# Patient Record
Sex: Female | Born: 2000 | Race: Black or African American | Hispanic: No | Marital: Single | Smoking: Never smoker
Health system: Southern US, Community
[De-identification: ages and names within clinical notes are randomized; demographics above are authoritative.]

## PROBLEM LIST (undated history)

## (undated) ENCOUNTER — Ambulatory Visit (HOSPITAL_COMMUNITY): Admission: EM | Payer: Self-pay | Source: Home / Self Care

## (undated) ENCOUNTER — Ambulatory Visit (HOSPITAL_COMMUNITY): Admission: EM | Payer: Medicaid Other | Source: Home / Self Care

## (undated) DIAGNOSIS — L309 Dermatitis, unspecified: Secondary | ICD-10-CM

---
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
## 2022-10-20 ENCOUNTER — Emergency Department (HOSPITAL_COMMUNITY): Payer: Medicaid Other

## 2022-10-20 ENCOUNTER — Other Ambulatory Visit: Payer: Self-pay

## 2022-10-20 ENCOUNTER — Emergency Department (HOSPITAL_COMMUNITY)
Admission: EM | Admit: 2022-10-20 | Discharge: 2022-10-20 | Disposition: A | Discharge status: Home / Self Care | Payer: Medicaid Other | Attending: Emergency Medicine | Admitting: Emergency Medicine

## 2022-10-20 ENCOUNTER — Encounter (HOSPITAL_COMMUNITY): Payer: Self-pay

## 2022-10-20 DIAGNOSIS — J069 Acute upper respiratory infection, unspecified: Secondary | ICD-10-CM | POA: Diagnosis not present

## 2022-10-20 DIAGNOSIS — Z1152 Encounter for screening for COVID-19: Secondary | ICD-10-CM | POA: Diagnosis not present

## 2022-10-20 DIAGNOSIS — R0602 Shortness of breath: Secondary | ICD-10-CM | POA: Diagnosis present

## 2022-10-20 DIAGNOSIS — Z91010 Allergy to peanuts: Secondary | ICD-10-CM | POA: Insufficient documentation

## 2022-10-20 DIAGNOSIS — J45901 Unspecified asthma with (acute) exacerbation: Secondary | ICD-10-CM

## 2022-10-20 LAB — RESP PANEL BY RT-PCR (RSV, FLU A&B, COVID)  RVPGX2
Influenza A by PCR: NEGATIVE
Influenza B by PCR: NEGATIVE
Resp Syncytial Virus by PCR: NEGATIVE
SARS Coronavirus 2 by RT PCR: NEGATIVE

## 2022-10-20 MED ORDER — PREDNISONE 20 MG PO TABS
20.0000 mg | ORAL_TABLET | Freq: Once | ORAL | Status: AC
  Administered 2022-10-20: 20 mg via ORAL
  Filled 2022-10-20: qty 1

## 2022-10-20 MED ORDER — PREDNISONE 20 MG PO TABS: 20.0000 mg | ORAL_TABLET | Freq: Every day | ORAL | 0 refills | Status: AC

## 2022-10-20 MED ORDER — GUAIFENESIN-DM 100-10 MG/5ML PO SYRP: 10.0000 mL | ORAL_SOLUTION | Freq: Two times a day (BID) | ORAL | 0 refills | Status: AC

## 2022-10-20 MED ORDER — BUDESONIDE-FORMOTEROL FUMARATE 160-4.5 MCG/ACT IN AERO: 2.0000 | INHALATION_SPRAY | Freq: Two times a day (BID) | RESPIRATORY_TRACT | 0 refills | Status: AC

## 2022-10-20 MED ORDER — ALBUTEROL SULFATE HFA 108 (90 BASE) MCG/ACT IN AERS
8.0000 | INHALATION_SPRAY | Freq: Once | RESPIRATORY_TRACT | Status: AC
  Administered 2022-10-20: 8 via RESPIRATORY_TRACT
  Filled 2022-10-20: qty 6.7

## 2022-10-20 MED ORDER — IPRATROPIUM-ALBUTEROL 0.5-2.5 (3) MG/3ML IN SOLN: 3.0000 mL | Freq: Once | RESPIRATORY_TRACT | Status: DC

## 2022-10-20 MED ORDER — ALBUTEROL SULFATE HFA 108 (90 BASE) MCG/ACT IN AERS: 1.0000 | INHALATION_SPRAY | Freq: Four times a day (QID) | RESPIRATORY_TRACT | 0 refills | Status: AC | PRN

## 2022-10-20 NOTE — Progress Notes (Signed)
Per discussion w/ MD regarding HHN order, pt is in hallway w/ pending covid test, MD ordered MDI to be given instead of HHN.  Per MD, hold off giving [facility] for now.  No distress noted, per pt she "feels much better" after albuterol MDI was given.

## 2022-10-20 NOTE — ED Provider Notes (Signed)
[name] Provider Note   CSN: [id number] Arrival date & time: 10/20/22  1122     History  Chief Complaint  Patient presents with   Shortness of [place] is a 22 y.o. female.  Patient is a 22 year old female with past medical history of asthma presenting for shortness of breath.  Patient admits to redness of breath and wheezing, worse with ambulation, x 4 to 5 days.  Admits to recent upper respiratory symptoms including nasal congestion, rhinorrhea, and cough that she states is currently improving however cough is still present.  States she has a nebulizer at the house. Has been using Symbicort daily but PRN instead of scheduled.  Does not have a rescue albuterol inhaler.  No prior intubations or admissions for asthma exacerbations.  The history is provided by the patient. No language interpreter was used.  Shortness of Breath Associated symptoms: wheezing   Associated symptoms: no abdominal pain, no chest pain, no cough, no ear pain, no fever, no rash, no sore throat and no vomiting        Home Medications Prior to Admission medications   Medication Sig Start Date End Date Taking? Authorizing Provider  albuterol (VENTOLIN HFA) 108 (90 Base) MCG/ACT inhaler Inhale 1-2 puffs into the lungs every 6 (six) hours as needed for wheezing or shortness of breath. 0/1/02  Yes Campbell Stall P, DO  budesonide-formoterol (SYMBICORT) 160-4.5 MCG/ACT inhaler Inhale 2 puffs into the lungs in the morning and at bedtime. 03/20/52  Yes Campbell Stall P, DO  guaiFENesin-dextromethorphan (ROBITUSSIN DM) 100-10 MG/5ML syrup Take 10 mLs by mouth in the morning and at bedtime. 02/23/43  Yes Campbell Stall P, DO  predniSONE (DELTASONE) 20 MG tablet Take 1 tablet (20 mg total) by mouth daily for 5 days. 10/20/22 0/3/47 Yes Campbell Stall P, DO  beclomethasone (QVAR) 40 MCG/ACT inhaler Inhale 2 puffs into the lungs 2 (two) times daily. 07/15/13   Mariel Aloe, MD  beclomethasone (QVAR) 80 MCG/ACT inhaler Use as directed 11/13/13   Charmayne Sheer, NP  Cetirizine HCl (ZYRTEC ALLERGY) 10 MG CAPS 1 cap po qd 11/13/13   Charmayne Sheer, NP  cetirizine HCl (ZYRTEC) 5 MG/5ML SYRP Take 5 mLs (5 mg total) by mouth at bedtime. 07/15/13   Mariel Aloe, MD  EPINEPHrine (EPIPEN JR) 0.15 MG/0.3ML injection Inject 0.3 mLs (0.15 mg total) into the muscle as needed (anaphylaxis - pen to bedside). 07/15/13   Mariel Aloe, MD  hydrocerin (EUCERIN) CREA Apply 1 application topically 2 (two) times daily. 07/15/13   Mariel Aloe, MD  ibuprofen (ADVIL,MOTRIN) 100 MG/5ML suspension Take 30 mLs (600 mg total) by mouth every 6 (six) hours as needed for mild pain. 01/13/15   Isaac Bliss, MD      Allergies    Peanut-containing drug products    Review of Systems   Review of Systems  Constitutional:  Negative for chills and fever.  HENT:  Negative for ear pain and sore throat.   Eyes:  Negative for pain and visual disturbance.  Respiratory:  Positive for chest tightness, shortness of breath and wheezing. Negative for cough.   Cardiovascular:  Negative for chest pain and palpitations.  Gastrointestinal:  Negative for abdominal pain and vomiting.  Genitourinary:  Negative for dysuria and hematuria.  Musculoskeletal:  Negative for arthralgias and back pain.  Skin:  Negative for color change and rash.  Neurological:  Negative for seizures and syncope.  All  other systems reviewed and are negative.   Physical Exam Updated Vital Signs BP (!) 158/85 (BP Location: Right Arm)   Pulse (!) 115   Temp 98.1 F (36.7 C)   Resp 18   Ht 4' 9.25" (1.454 m)   Wt 65.1 kg   SpO2 97%   BMI 30.79 kg/m  Physical Exam Vitals and nursing note reviewed.  Constitutional:      General: She is not in acute distress.    Appearance: She is well-developed.  HENT:     Head: Normocephalic and atraumatic.  Eyes:     Conjunctiva/sclera: Conjunctivae normal.  Cardiovascular:      Rate and Rhythm: Normal rate and regular rhythm.     Heart sounds: No murmur heard. Pulmonary:     Effort: Pulmonary effort is normal. No respiratory distress.     Breath sounds: Examination of the right-upper field reveals wheezing. Examination of the left-upper field reveals wheezing. Examination of the right-middle field reveals wheezing. Examination of the left-middle field reveals wheezing. Examination of the right-lower field reveals wheezing. Examination of the left-lower field reveals wheezing. Wheezing present.  Abdominal:     Palpations: Abdomen is soft.     Tenderness: There is no abdominal tenderness.  Musculoskeletal:        General: No swelling.     Cervical back: Neck supple.  Skin:    General: Skin is warm and dry.     Capillary Refill: Capillary refill takes less than 2 seconds.  Neurological:     Mental Status: She is alert.  Psychiatric:        Mood and Affect: Mood normal.     ED Results / Procedures / Treatments   Labs (all labs ordered are listed, but only abnormal results are displayed) Labs Reviewed  RESP PANEL BY RT-PCR (RSV, FLU A&B, COVID)  [id number]    EKG None  Radiology DG Chest 2 View  Result Date: 10/20/2022 CLINICAL DATA:  Provided history: Shortness of breath. History of asthma. EXAM: CHEST - 2 VIEW COMPARISON:  Chest radiograph 09/25/2003. FINDINGS: Heart size within normal limits. Peribronchial thickening. No appreciable airspace consolidation. No evidence of pleural effusion or pneumothorax. No acute bony abnormality identified. IMPRESSION: Peribronchial thickening, a finding which may be due to viral infection, asthma or other causes of chronic airway inflammation. No evidence of airspace consolidation. Electronically Signed   By: Kellie Simmering D.O.   On: 10/20/2022 12:13    Procedures Procedures    Medications Ordered in ED Medications  ipratropium-albuterol (DUONEB) 0.5-2.5 (3) MG/3ML nebulizer solution 3 mL (0 mLs Nebulization Hold  10/20/22 1319)  predniSONE (DELTASONE) tablet 20 mg (20 mg Oral Given 10/20/22 1317)  albuterol (VENTOLIN HFA) 108 (90 Base) MCG/ACT inhaler 8 puff (8 puffs Inhalation Given 10/20/22 1302)    ED Course/ Medical Decision Making/ A&P                             Medical Decision Making Risk Prescription drug management.   73:46 PM 22 year old female with past medical history of asthma presenting for shortness of breath.  Patient is alert and entry, no acute distress, afebrile, stable vital signs.  Physical exam demonstrates diffuse expiratory wheezing in all lung fields.  Albuterol and prednisone given.  Chest x-ray demonstrates Peribronchial thickening, a finding which may be due to viral infection, asthma or other causes of chronic airway inflammation. COVID, influenza, and RSV PCR negative.  No signs or symptoms  of sepsis.  Patient nontoxic-appearing.  Improvement of wheezing after albuterol.  Stable for discharge home with prednisone, albuterol, and Symbicort refills.  Symptoms likely due to acute asthma exacerbation secondary to viral URI.  Patient in no distress and overall condition improved here in the ED. Detailed discussions were had with the patient regarding current findings, and need for close f/u with PCP or on call doctor. The patient has been instructed to return immediately if the symptoms worsen in any way for re-evaluation. Patient verbalized understanding and is in agreement with current care plan. All questions answered prior to discharge.         Final Clinical Impression(s) / ED Diagnoses Final diagnoses:  Exacerbation of asthma, unspecified asthma severity, unspecified whether persistent  Viral URI with cough    Rx / DC Orders ED Discharge Orders          Ordered    albuterol (VENTOLIN HFA) 108 (90 Base) MCG/ACT inhaler  Every 6 hours PRN        10/20/22 1223    budesonide-formoterol (SYMBICORT) 160-4.5 MCG/ACT inhaler  2 times daily        10/20/22 1223     predniSONE (DELTASONE) 20 MG tablet  Daily        10/20/22 1223    guaiFENesin-dextromethorphan (ROBITUSSIN DM) 100-10 MG/5ML syrup  2 times daily        10/20/22 1418              Lianne Cure, DO 42/70/62 1418

## 2022-10-20 NOTE — ED Provider Triage Note (Signed)
Emergency Medicine Provider Triage Evaluation Note  Yolanda Austin , a 22 y.o. female  was evaluated in triage.  Pt complains of increased mild shortness of breath with walking for long periods.  Recent URI symptoms over the last 4 to 5 days.  Hx of asthma.  Takes Symbicort, some relief, needs refill.  Denies chest pain, fevers, sore throat.  Review of Systems  Positive:  Negative: See above  Physical Exam  BP (!) 158/85 (BP Location: Right Arm)   Pulse (!) 115   Resp 18   Ht 4' 9.25" (1.454 m)   Wt 65.1 kg   SpO2 97%   BMI 30.79 kg/m  Gen:   Awake, no distress   Resp:  Normal effort  MSK:   Moves extremities without difficulty  Other:  Sitting comfortably. Communicating without difficulty.  Chest nonTTP.  Mild wheezing bilat.  Medical Decision Making  Medically screening exam initiated at 11:34 AM.  Appropriate orders placed.  Yolanda Austin was informed that the remainder of the evaluation will be completed by another provider, this initial triage assessment does not replace that evaluation, and the importance of remaining in the ED until their evaluation is complete.      Yolanda Rome, PA-C 09/98/33 1147

## 2022-10-20 NOTE — ED Notes (Signed)
Did EKG shown to er provider

## 2022-10-20 NOTE — ED Triage Notes (Signed)
Pt c/o SOBx2d. Pt has nasal congestionx3d. Pt denies any other sx. Pt is eupneic. Pt does not appear to be in resp distress at this time.
